# Patient Record
Sex: Male | Born: 1995 | Race: White | Hispanic: No | Marital: Single
Health system: Southern US, Community
[De-identification: ages and names within clinical notes are randomized; demographics above are authoritative.]

---
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
## 2020-10-08 DIAGNOSIS — M546 Pain in thoracic spine: Secondary | ICD-10-CM | POA: Diagnosis not present

## 2020-10-08 DIAGNOSIS — S299XXA Unspecified injury of thorax, initial encounter: Secondary | ICD-10-CM | POA: Diagnosis not present

## 2022-05-31 ENCOUNTER — Telehealth: Payer: Self-pay | Admitting: *Deleted

## 2022-05-31 NOTE — Patient Outreach (Signed)
  Care Coordination   05/31/2022 Name: Marc Mcpherson MRN: [id number] DOB: 1995/12/08   Care Coordination Outreach Attempts:  An unsuccessful telephone outreach was attempted today to offer the patient information about available care coordination services as a benefit of their health plan.   Follow Up Plan:  Additional outreach attempts will be made to offer the patient care coordination information and services.   Encounter Outcome:  No Answer  Care Coordination Interventions Activated:  No   Care Coordination Interventions:  No, not indicated    Irving Shows [facility], BSN [facility] RN Care Coordinator [phone]

## 2022-06-01 ENCOUNTER — Telehealth: Payer: Self-pay | Admitting: *Deleted

## 2022-06-01 NOTE — Patient Outreach (Signed)
  Care Coordination   06/01/2022 Name: Marc Mcpherson MRN: [id number] DOB: 1995/12/31   Care Coordination Outreach Attempts:  A second unsuccessful outreach was attempted today to offer the patient with information about available care coordination services as a benefit of their health plan.     Follow Up Plan:  Additional outreach attempts will be made to offer the patient care coordination information and services.   Encounter Outcome:  No Answer  Care Coordination Interventions Activated:  No   Care Coordination Interventions:  No, not indicated    Irving Shows [facility], BSN [facility] RN Care Coordinator [phone]

## 2022-06-02 ENCOUNTER — Telehealth: Payer: Self-pay | Admitting: *Deleted

## 2022-06-02 NOTE — Patient Outreach (Signed)
  Care Coordination   06/02/2022 Name: Marc Mcpherson MRN: [id number] DOB: April 29, 1996   Care Coordination Outreach Attempts:  A third unsuccessful outreach was attempted today to offer the patient with information about available care coordination services as a benefit of their health plan.   Follow Up Plan:  No further outreach attempts will be made at this time. We have been unable to contact the patient to offer or enroll patient in care coordination services  Encounter Outcome:  No Answer  Care Coordination Interventions Activated:  No   Care Coordination Interventions:  No, not indicated    Irving Shows [facility], BSN [facility] RN Care Coordinator [phone]
